# Patient Record
Sex: Male | Born: 1965 | Race: Black or African American | Hispanic: No | Marital: Married | Smoking: Never smoker
Health system: Southern US, Community
[De-identification: ages and names within clinical notes are randomized; demographics above are authoritative.]

## PROBLEM LIST (undated history)

## (undated) HISTORY — PX: APPENDECTOMY: SHX54

---

## 2018-12-02 ENCOUNTER — Encounter (HOSPITAL_BASED_OUTPATIENT_CLINIC_OR_DEPARTMENT_OTHER): Payer: Self-pay | Admitting: Emergency Medicine

## 2018-12-02 ENCOUNTER — Emergency Department (HOSPITAL_BASED_OUTPATIENT_CLINIC_OR_DEPARTMENT_OTHER): Payer: 59

## 2018-12-02 ENCOUNTER — Emergency Department (HOSPITAL_BASED_OUTPATIENT_CLINIC_OR_DEPARTMENT_OTHER)
Admission: EM | Admit: 2018-12-02 | Discharge: 2018-12-02 | Disposition: A | Discharge status: Home / Self Care | Payer: 59 | Attending: Emergency Medicine | Admitting: Emergency Medicine

## 2018-12-02 ENCOUNTER — Other Ambulatory Visit: Payer: Self-pay

## 2018-12-02 DIAGNOSIS — W0110XA Fall on same level from slipping, tripping and stumbling with subsequent striking against unspecified object, initial encounter: Secondary | ICD-10-CM | POA: Insufficient documentation

## 2018-12-02 DIAGNOSIS — Y9389 Activity, other specified: Secondary | ICD-10-CM | POA: Diagnosis not present

## 2018-12-02 DIAGNOSIS — Y998 Other external cause status: Secondary | ICD-10-CM | POA: Diagnosis not present

## 2018-12-02 DIAGNOSIS — S01521A Laceration with foreign body of lip, initial encounter: Secondary | ICD-10-CM | POA: Insufficient documentation

## 2018-12-02 DIAGNOSIS — Y92009 Unspecified place in unspecified non-institutional (private) residence as the place of occurrence of the external cause: Secondary | ICD-10-CM | POA: Insufficient documentation

## 2018-12-02 DIAGNOSIS — S01511A Laceration without foreign body of lip, initial encounter: Secondary | ICD-10-CM | POA: Diagnosis present

## 2018-12-02 DIAGNOSIS — R55 Syncope and collapse: Secondary | ICD-10-CM | POA: Diagnosis not present

## 2018-12-02 DIAGNOSIS — Z23 Encounter for immunization: Secondary | ICD-10-CM | POA: Insufficient documentation

## 2018-12-02 MED ORDER — AMOXICILLIN-POT CLAVULANATE 875-125 MG PO TABS: 1.0000 | ORAL_TABLET | Freq: Two times a day (BID) | ORAL | 0 refills | Status: AC

## 2018-12-02 MED ORDER — LIDOCAINE-EPINEPHRINE 1 %-1:100000 IJ SOLN
INTRAMUSCULAR | Status: AC
  Administered 2018-12-02: 1 mL
  Filled 2018-12-02: qty 1

## 2018-12-02 MED ORDER — AMOXICILLIN-POT CLAVULANATE 875-125 MG PO TABS
1.0000 | ORAL_TABLET | Freq: Once | ORAL | Status: AC
  Administered 2018-12-02: 1 via ORAL
  Filled 2018-12-02: qty 1

## 2018-12-02 MED ORDER — LIDOCAINE-EPINEPHRINE 2 %-1:100000 IJ SOLN
20.0000 mL | Freq: Once | INTRAMUSCULAR | Status: DC
  Filled 2018-12-02: qty 20

## 2018-12-02 MED ORDER — IBUPROFEN 600 MG PO TABS: 600.0000 mg | ORAL_TABLET | Freq: Four times a day (QID) | ORAL | 0 refills | Status: AC | PRN

## 2018-12-02 MED ORDER — TETANUS-DIPHTH-ACELL PERTUSSIS 5-2.5-18.5 LF-MCG/0.5 IM SUSP
0.5000 mL | Freq: Once | INTRAMUSCULAR | Status: AC
  Administered 2018-12-02: 0.5 mL via INTRAMUSCULAR
  Filled 2018-12-02: qty 0.5

## 2018-12-02 NOTE — Discharge Instructions (Addendum)
Take the antibiotics as prescribed.  Follow-up with the oral surgeon or your dentist.  If the sutures are still present in 10 daysthey should be removed by your primary doctor or dentist.  Return to the ED with new or worsening symptoms.

## 2018-12-02 NOTE — ED Provider Notes (Signed)
[facility] EMERGENCY DEPARTMENT Provider Note   CSN: [id number] Arrival date & time: 12/02/18  0302     History   Chief Complaint Chief Complaint  Patient presents with  . Mouth Injury    HPI Joel Fleming is a 52 y.o. male.  Patient presents with mouth injury after a fall at home.  States he got out of the bed to go to the bathroom in the dark and began to feel lightheaded.  He then fell forward and struck his face and unknown surface.  Did not lose consciousness.  Denies any preceding chest pain or shortness of breath.  No room spinning.  Did have nausea and felt like he needed to go to the bathroom.  Did not have any diarrhea.  Knocked out left upper tooth at home and sustained laceration to his lower lip.  Denies any blood thinner use.  No focal weakness, numbness or tingling.  The history is provided by the patient and the spouse.  Mouth Injury  Associated symptoms include headaches. Pertinent negatives include no chest pain, no abdominal pain and no shortness of breath.    History reviewed. No pertinent past medical history.  There are no active problems to display for this patient.   Past Surgical History:  Procedure Laterality Date  . APPENDECTOMY          Home Medications    Prior to Admission medications   Not on File    Family History No family history on file.  Social History Social History   Tobacco Use  . Smoking status: Never Smoker  . Smokeless tobacco: Never Used  Substance Use Topics  . Alcohol use: Never    Frequency: Never  . Drug use: Never     Allergies   Patient has no known allergies.   Review of Systems Review of Systems  Constitutional: Negative for activity change, appetite change and fever.  HENT: Negative for congestion.   Eyes: Negative for visual disturbance.  Respiratory: Negative for cough, chest tightness and shortness of breath.   Cardiovascular: Negative for chest pain.  Gastrointestinal:  Negative for abdominal pain, nausea and vomiting.  Genitourinary: Negative for dysuria and hematuria.  Musculoskeletal: Positive for myalgias.  Skin: Positive for wound.  Neurological: Positive for headaches. Negative for dizziness and syncope.    all other systems are negative except as noted in the HPI and PMH.    Physical Exam Updated Vital Signs BP (!) 151/97 (BP Location: Right Arm)   Pulse 67   Temp 99.1 F (37.3 C) (Oral)   Resp 18   Ht 5\' 10"  (1.778 m)   Wt 88.5 kg   SpO2 100%   BMI 27.98 kg/m   Physical Exam  Constitutional: He is oriented to person, place, and time. He appears well-developed and well-nourished. No distress.  HENT:  Head: Normocephalic and atraumatic.  Mouth/Throat: Oropharynx is clear and moist. No oropharyngeal exudate.  Left upper incisor is missing.  Left second incisor is loose.  Multiple missing teeth throughout that are chronic.  No malocclusion or trismus.  Through and through laceration of lower lip as depicted.  Eyes: Pupils are equal, round, and reactive to light. Conjunctivae and EOM are normal.  Neck: Normal range of motion. Neck supple.  No meningismus.  No C-spine tenderness  Cardiovascular: Normal rate, regular rhythm, normal heart sounds and intact distal pulses.  No murmur heard. Pulmonary/Chest: Effort normal and breath sounds normal. No respiratory distress.  Abdominal: Soft. There is no tenderness.  There is no rebound and no guarding.  Musculoskeletal: Normal range of motion. He exhibits no edema or tenderness.  Neurological: He is alert and oriented to person, place, and time. No cranial nerve deficit. He exhibits normal muscle tone. Coordination normal.  No ataxia on finger to nose bilaterally. No pronator drift. 5/5 strength throughout. CN 2-12 intact.Equal grip strength. Sensation intact.   Skin: Skin is warm.  Psychiatric: He has a normal mood and affect. His behavior is normal.  Nursing note and vitals  reviewed.          ED Treatments / Results  Labs (all labs ordered are listed, but only abnormal results are displayed) Labs Reviewed - No data to display  EKG EKG Interpretation  Date/Time:  Sunday December 02 2018 05:07:09 EST Ventricular Rate:  73 PR Interval:    QRS Duration: 88 QT Interval:  376 QTC Calculation: 415 R Axis:   52 Text Interpretation:  Sinus rhythm Borderline T abnormalities, inferior leads No old tracing to compare Confirmed by Glynn Octave [phone]) on 12/02/2018 5:39:00 AM   Radiology Ct Head Wo Contrast  Result Date: 12/02/2018 CLINICAL DATA:  Initial evaluation for acute trauma, fall. EXAM: CT HEAD WITHOUT CONTRAST CT MAXILLOFACIAL WITHOUT CONTRAST CT CERVICAL SPINE WITHOUT CONTRAST TECHNIQUE: Multidetector CT imaging of the head, cervical spine, and maxillofacial structures were performed using the standard protocol without intravenous contrast. Multiplanar CT image reconstructions of the cervical spine and maxillofacial structures were also generated. COMPARISON:  None. FINDINGS: CT HEAD FINDINGS Brain: Cerebral volume within normal limits. No acute intracranial hemorrhage. No acute large vessel territory infarct. No mass lesion, midline shift or mass effect. No hydrocephalus. No extra-axial fluid collection. Vascular: No hyperdense vessel. Skull: Scalp soft tissues within normal limits.  Calvarium intact. Other: Mastoid air cells clear. CT MAXILLOFACIAL FINDINGS Osseous: Zygomatic arches intact. Pterygoid plates intact. Nasal bones intact. Nasal septum midline and intact. Probable recent loss of the left maxillary central incisor. Associated nondisplaced fracture through the adjacent alveolar ridge (series 7, image 35). Fracture involves the root of the adjacent left lateral incisor. Alveolar plate otherwise intact. No acute mandibular fracture. Mandibular condyles normally situated. Poor dentition with multiple scattered dental caries noted within the  remaining teeth. Orbits: Globes and orbital soft tissues within normal limits. Bony orbits intact. Sinuses: Mild left maxillary sinus mucosal thickening. No hemosinus. Soft tissues: Soft tissue laceration present at the lower lip. No other acute soft tissue injury. CT CERVICAL SPINE FINDINGS Alignment: Straightening of the normal cervical lordosis. No listhesis or malalignment. Skull base and vertebrae: Skull base intact. Normal C1-2 articulations are preserved in the dens is intact. Vertebral body heights maintained. No acute fracture. Soft tissues and spinal canal: Visualized soft tissues of the neck demonstrate no acute finding. No abnormal prevertebral edema. Spinal canal within normal limits. Disc levels: Mild multilevel cervical spondylolysis at C3-4 through C6-7. Upper chest: Visualized upper chest demonstrates no acute finding. Visualized lung apices are clear. Other: None. IMPRESSION: CT HEAD: Negative head CT.  No acute intracranial abnormality. CT MAXILLOFACIAL: 1. Acute dislocation of the left maxillary central incisor with associated nondisplaced fracture through the underlying alveolar ridge. 2. Soft tissue swelling with laceration at the lower lip. 3. Poor dentition. CT CERVICAL SPINE: No acute traumatic injury within the cervical spine. Electronically Signed   By: Rise Mu M.D.   On: 12/02/2018 05:17   Ct Cervical Spine Wo Contrast  Result Date: 12/02/2018 CLINICAL DATA:  Initial evaluation for acute trauma, fall. EXAM: CT HEAD  WITHOUT CONTRAST CT MAXILLOFACIAL WITHOUT CONTRAST CT CERVICAL SPINE WITHOUT CONTRAST TECHNIQUE: Multidetector CT imaging of the head, cervical spine, and maxillofacial structures were performed using the standard protocol without intravenous contrast. Multiplanar CT image reconstructions of the cervical spine and maxillofacial structures were also generated. COMPARISON:  None. FINDINGS: CT HEAD FINDINGS Brain: Cerebral volume within normal limits. No acute  intracranial hemorrhage. No acute large vessel territory infarct. No mass lesion, midline shift or mass effect. No hydrocephalus. No extra-axial fluid collection. Vascular: No hyperdense vessel. Skull: Scalp soft tissues within normal limits.  Calvarium intact. Other: Mastoid air cells clear. CT MAXILLOFACIAL FINDINGS Osseous: Zygomatic arches intact. Pterygoid plates intact. Nasal bones intact. Nasal septum midline and intact. Probable recent loss of the left maxillary central incisor. Associated nondisplaced fracture through the adjacent alveolar ridge (series 7, image 35). Fracture involves the root of the adjacent left lateral incisor. Alveolar plate otherwise intact. No acute mandibular fracture. Mandibular condyles normally situated. Poor dentition with multiple scattered dental caries noted within the remaining teeth. Orbits: Globes and orbital soft tissues within normal limits. Bony orbits intact. Sinuses: Mild left maxillary sinus mucosal thickening. No hemosinus. Soft tissues: Soft tissue laceration present at the lower lip. No other acute soft tissue injury. CT CERVICAL SPINE FINDINGS Alignment: Straightening of the normal cervical lordosis. No listhesis or malalignment. Skull base and vertebrae: Skull base intact. Normal C1-2 articulations are preserved in the dens is intact. Vertebral body heights maintained. No acute fracture. Soft tissues and spinal canal: Visualized soft tissues of the neck demonstrate no acute finding. No abnormal prevertebral edema. Spinal canal within normal limits. Disc levels: Mild multilevel cervical spondylolysis at C3-4 through C6-7. Upper chest: Visualized upper chest demonstrates no acute finding. Visualized lung apices are clear. Other: None. IMPRESSION: CT HEAD: Negative head CT.  No acute intracranial abnormality. CT MAXILLOFACIAL: 1. Acute dislocation of the left maxillary central incisor with associated nondisplaced fracture through the underlying alveolar ridge. 2.  Soft tissue swelling with laceration at the lower lip. 3. Poor dentition. CT CERVICAL SPINE: No acute traumatic injury within the cervical spine. Electronically Signed   By: Rise MuBenjamin  McClintock M.D.   On: 12/02/2018 05:17   Ct Maxillofacial Wo Contrast  Result Date: 12/02/2018 CLINICAL DATA:  Initial evaluation for acute trauma, fall. EXAM: CT HEAD WITHOUT CONTRAST CT MAXILLOFACIAL WITHOUT CONTRAST CT CERVICAL SPINE WITHOUT CONTRAST TECHNIQUE: Multidetector CT imaging of the head, cervical spine, and maxillofacial structures were performed using the standard protocol without intravenous contrast. Multiplanar CT image reconstructions of the cervical spine and maxillofacial structures were also generated. COMPARISON:  None. FINDINGS: CT HEAD FINDINGS Brain: Cerebral volume within normal limits. No acute intracranial hemorrhage. No acute large vessel territory infarct. No mass lesion, midline shift or mass effect. No hydrocephalus. No extra-axial fluid collection. Vascular: No hyperdense vessel. Skull: Scalp soft tissues within normal limits.  Calvarium intact. Other: Mastoid air cells clear. CT MAXILLOFACIAL FINDINGS Osseous: Zygomatic arches intact. Pterygoid plates intact. Nasal bones intact. Nasal septum midline and intact. Probable recent loss of the left maxillary central incisor. Associated nondisplaced fracture through the adjacent alveolar ridge (series 7, image 35). Fracture involves the root of the adjacent left lateral incisor. Alveolar plate otherwise intact. No acute mandibular fracture. Mandibular condyles normally situated. Poor dentition with multiple scattered dental caries noted within the remaining teeth. Orbits: Globes and orbital soft tissues within normal limits. Bony orbits intact. Sinuses: Mild left maxillary sinus mucosal thickening. No hemosinus. Soft tissues: Soft tissue laceration present at the lower lip.  No other acute soft tissue injury. CT CERVICAL SPINE FINDINGS Alignment:  Straightening of the normal cervical lordosis. No listhesis or malalignment. Skull base and vertebrae: Skull base intact. Normal C1-2 articulations are preserved in the dens is intact. Vertebral body heights maintained. No acute fracture. Soft tissues and spinal canal: Visualized soft tissues of the neck demonstrate no acute finding. No abnormal prevertebral edema. Spinal canal within normal limits. Disc levels: Mild multilevel cervical spondylolysis at C3-4 through C6-7. Upper chest: Visualized upper chest demonstrates no acute finding. Visualized lung apices are clear. Other: None. IMPRESSION: CT HEAD: Negative head CT.  No acute intracranial abnormality. CT MAXILLOFACIAL: 1. Acute dislocation of the left maxillary central incisor with associated nondisplaced fracture through the underlying alveolar ridge. 2. Soft tissue swelling with laceration at the lower lip. 3. Poor dentition. CT CERVICAL SPINE: No acute traumatic injury within the cervical spine. Electronically Signed   By: Rise Mu M.D.   On: 12/02/2018 05:17    Procedures .Marland KitchenLaceration Repair Date/Time: 12/02/2018 5:23 AM Performed by: Glynn Octave, MD Authorized by: Glynn Octave, MD   Consent:    Consent obtained:  Verbal   Consent given by:  Patient   Risks discussed:  Infection, pain, poor cosmetic result, poor wound healing and retained foreign body   Alternatives discussed:  No treatment Anesthesia (see MAR for exact dosages):    Anesthesia method:  Local infiltration   Local anesthetic:  Lidocaine 1% WITH epi Laceration details:    Location:  Lip   Lip location:  Lower lip, full thickness   Vermilion border involved: yes     Height of lip laceration:  More than half vertical height   Length (cm):  4 Repair type:    Repair type:  Complex Pre-procedure details:    Preparation:  Patient was prepped and draped in usual sterile fashion and imaging obtained to evaluate for foreign bodies Exploration:    Limited  defect created (wound extended): no     Hemostasis achieved with:  Epinephrine and direct pressure   Wound exploration: wound explored through full range of motion and entire depth of wound probed and visualized     Wound extent: foreign bodies/material and underlying fracture     Wound extent: no nerve damage noted and no tendon damage noted     Foreign bodies/material:  Debris   Contaminated: yes   Treatment:    Area cleansed with:  Shur-Clens and saline   Amount of cleaning:  Extensive   Irrigation solution:  Sterile water   Irrigation method:  Syringe   Visualized foreign bodies/material removed: yes     Debridement:  None   Undermining:  None   Scar revision: no   Skin repair:    Repair method:  Sutures   Suture size:  4-0   Wound skin closure material used: vicryl.   Suture technique:  Simple interrupted   Number of sutures:  6 Approximation:    Approximation:  Close   Vermilion border: well-aligned   Post-procedure details:    Dressing:  Antibiotic ointment   Patient tolerance of procedure:  Tolerated well, no immediate complications   (including critical care time)  Medications Ordered in ED Medications  Tdap (BOOSTRIX) injection 0.5 mL (has no administration in time range)  lidocaine-EPINEPHrine (XYLOCAINE W/EPI) 2 %-1:100000 (with pres) injection 20 mL (has no administration in time range)     Initial Impression / Assessment and Plan / ED Course  I have reviewed the triage vital signs and the  nursing notes.  Pertinent labs & imaging results that were available during my care of the patient were reviewed by me and considered in my medical decision making (see chart for details).    Near syncope with fall and facial injury with missing teeth and lower lip laceration.  No chest pain or shortness of breath.  Episode of dizziness was preceded by lightheadedness and nausea.  No vertigo.  Patient believes that he got up too quickly and fell in the dark.  Prodrome  suggests vasovagal syncope.  EKG is sinus rhythm.  There is no evidence of prolonged QT.  Complex through and through lip laceration with missing teeth.  Patient does not have teeth with him.  Wound extensively cleaned.  Laceration cleaned and repaired as above.  Patient will be given prophylactic antibiotics and tetanus updated.  Imaging shows no traumatic brain injury.  There is missing upper incisor with associated alveolar ridge fracture. Patient states the missing tooth is at home somewhere.  Patient to follow-up with his dentist as well as oral surgery.  He will be given prophylactic antibiotics.  Tetanus is up-to-date.  He is tolerating p.o. and ambulatory. Final Clinical Impressions(s) / ED Diagnoses   Final diagnoses:  Complicated laceration of lip, initial encounter  Syncope, unspecified syncope type    ED Discharge Orders    None       Hildegarde Dunaway, Jeannett Senior, MD 12/02/18 [phone]

## 2018-12-02 NOTE — ED Triage Notes (Signed)
S/p fall at home. States he thinks he got up too quickly, got dizzy and fell in the dark at home around 0245. Pt states one front tooth fell out, the other is loose. Lip lac to bottom R side of lip. Denies LOC.

## 2018-12-02 NOTE — ED Notes (Signed)
Patient transported to CT 

## 2019-11-30 IMAGING — CT CT MAXILLOFACIAL W/O CM
4 of 10 series · 16 of 47 positions shown, 17 images · non-contrast
Comparison: None.

CLINICAL DATA: Initial evaluation for acute trauma, fall.

EXAM:
CT HEAD WITHOUT CONTRAST
CT MAXILLOFACIAL WITHOUT CONTRAST
CT CERVICAL SPINE WITHOUT CONTRAST
TECHNIQUE: Multidetector CT imaging of the head, cervical spine, and
maxillofacial structures were performed using the standard protocol
without intravenous contrast. Multiplanar CT image reconstructions
of the cervical spine and maxillofacial structures were also
generated.

[Series 2: head wo · axial · 0.46mm/px · z∈[+889,+939]mm · 2 of 31 slices shown, 3 images]
[im 11/31  brain]
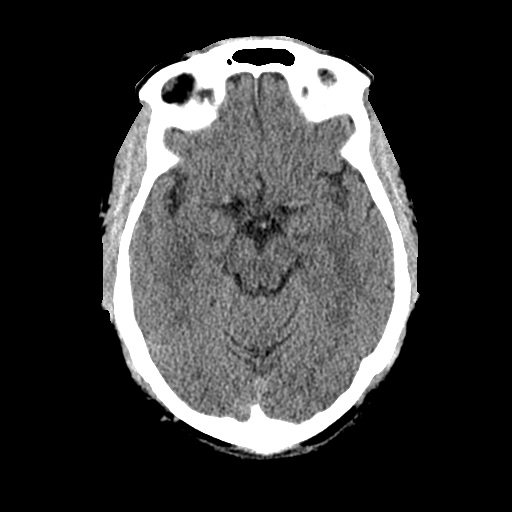
[im 11/31  bone]
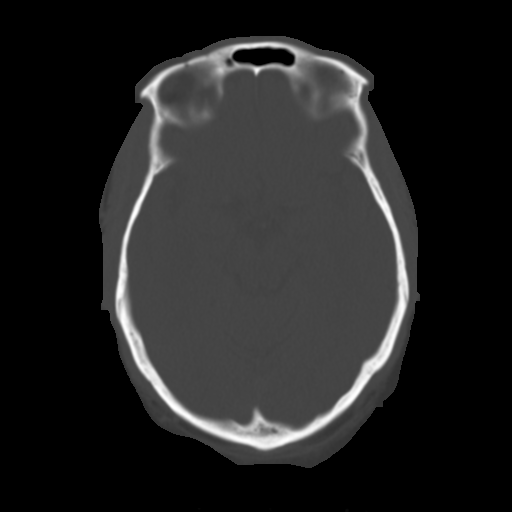
[im 21/31  bone]
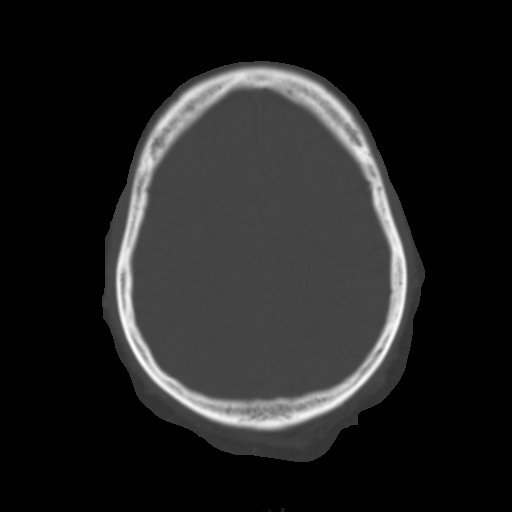

[Series 4: head coronal · coronal · 0.30mm/px · 3 of 84 slices shown]
[im 21/84  bone]
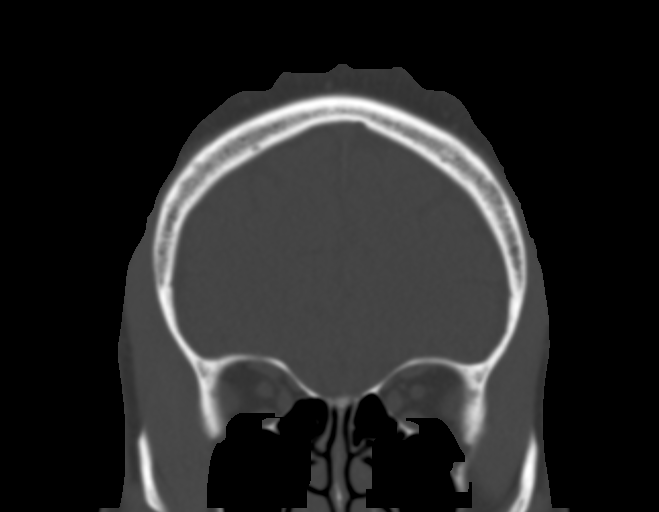
[im 42/84  bone]
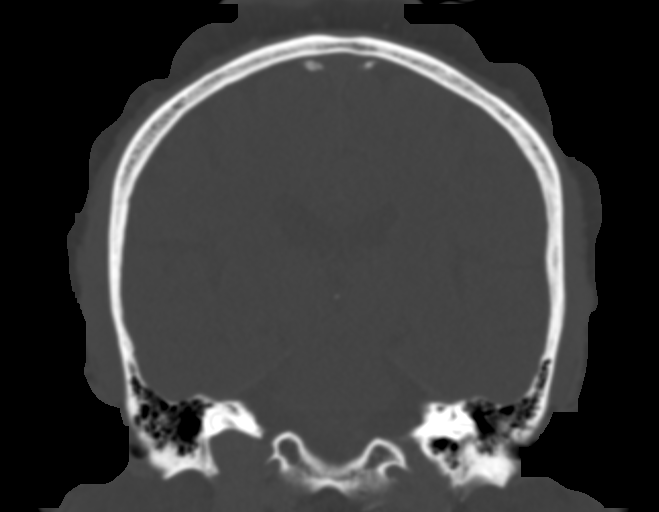
[im 63/84  bone]
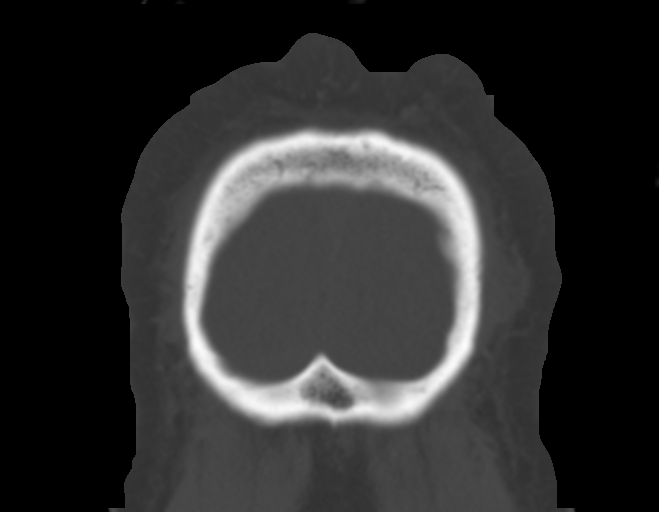

[Series 6: max soft · axial · 0.37mm/px · z∈[+751,+905]mm · 8 of 92 slices shown]
[im 8/92  brain]
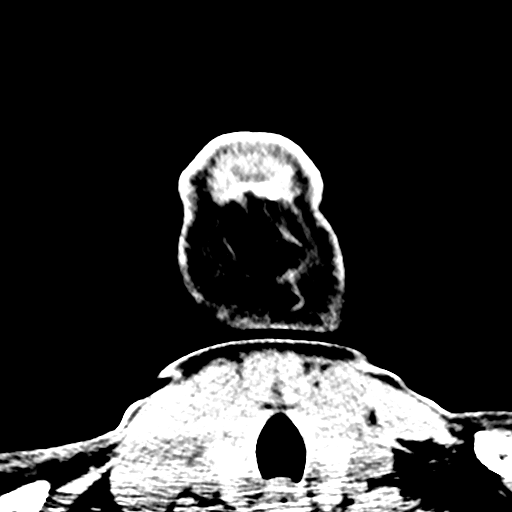
[im 22/92  brain]
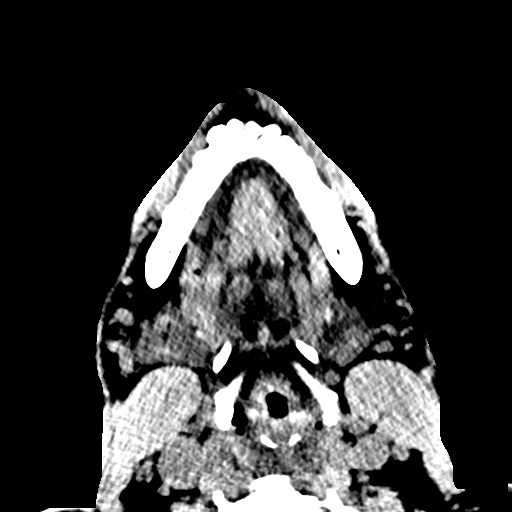
[im 29/92  brain]
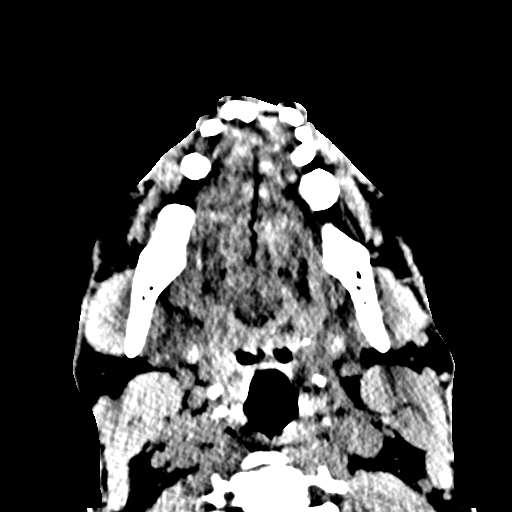
[im 43/92  brain]
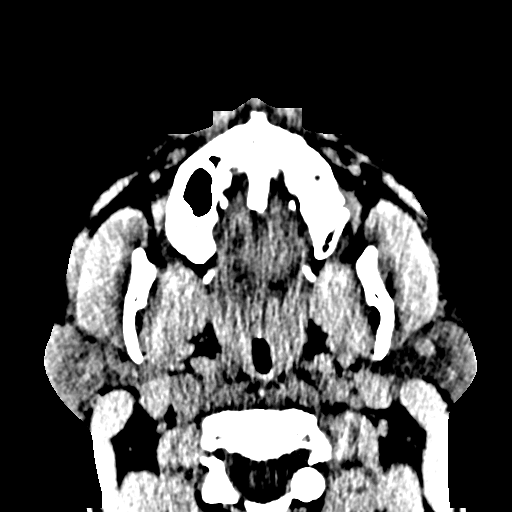
[im 50/92  brain]
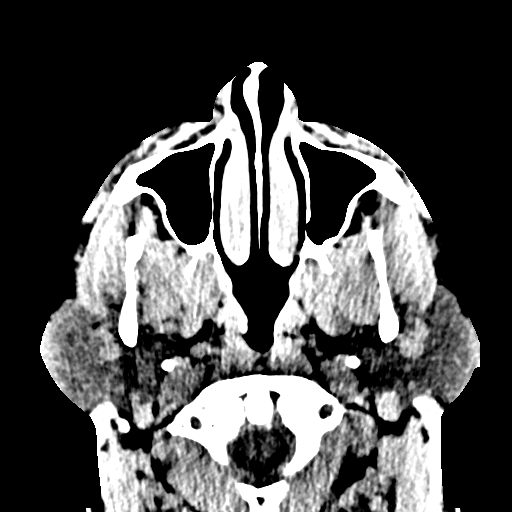
[im 64/92  brain]
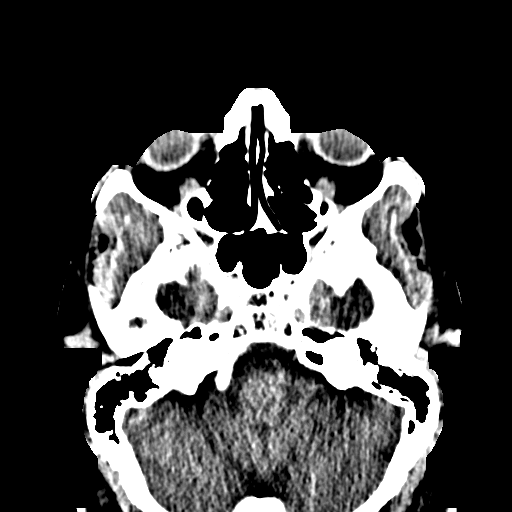
[im 71/92  brain]
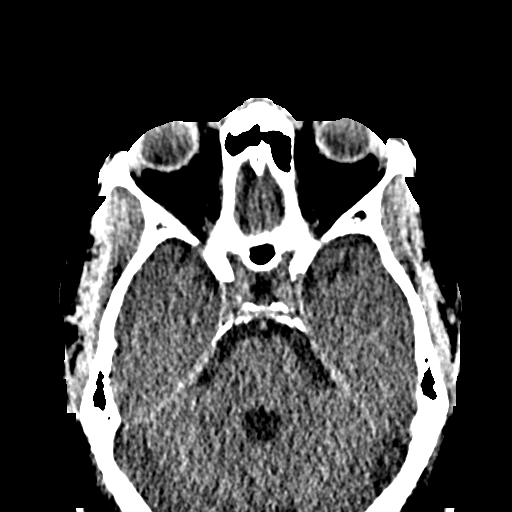
[im 85/92  brain]
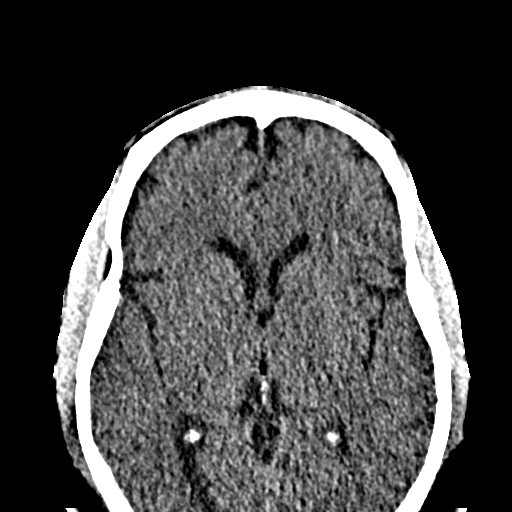

[Series 15: c spine soft · axial · 0.33mm/px · z∈[+728,+770]mm · 3 of 78 slices shown]
[im 8/78  brain]
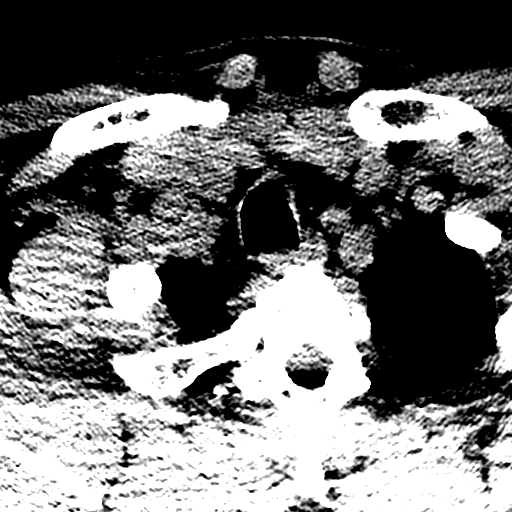
[im 15/78  brain]
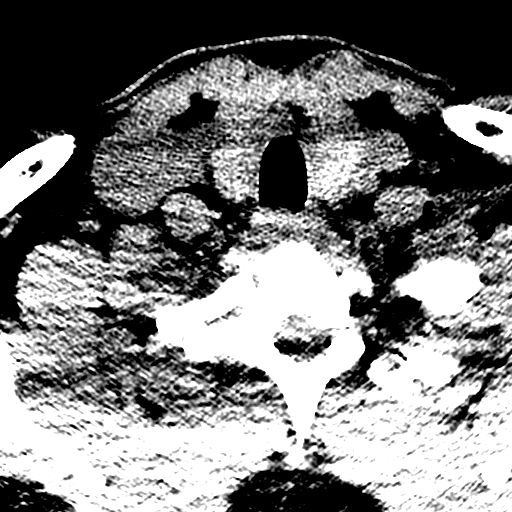
[im 29/78  brain]
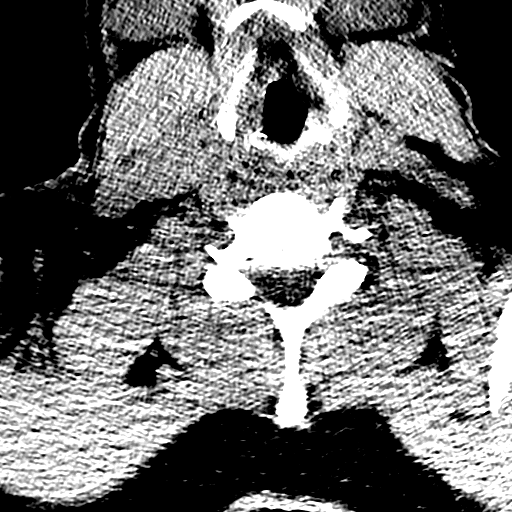

[16 of 47 positions shown; findings below may reference images not displayed]

FINDINGS: CT HEAD FINDINGS

Brain: Cerebral volume within normal limits. No acute intracranial
hemorrhage. No acute large vessel territory infarct. No mass lesion,
midline shift or mass effect. No hydrocephalus. No extra-axial fluid
collection.

Vascular: No hyperdense vessel.

Skull: Scalp soft tissues within normal limits.  Calvarium intact.

Other: Mastoid air cells clear.

CT MAXILLOFACIAL FINDINGS

Osseous: Zygomatic arches intact. Pterygoid plates intact. Nasal
bones intact. Nasal septum midline and intact. Probable recent loss
of the left maxillary central incisor. Associated nondisplaced
fracture through the adjacent alveolar ridge (series 7, image 35).
Fracture involves the root of the adjacent left lateral incisor.
Alveolar plate otherwise intact. No acute mandibular fracture.
Mandibular condyles normally situated. Poor dentition with multiple
scattered dental caries noted within the remaining teeth.

Orbits: Globes and orbital soft tissues within normal limits. Bony
orbits intact.

Sinuses: Mild left maxillary sinus mucosal thickening. No hemosinus.

Soft tissues: Soft tissue laceration present at the lower lip. No
other acute soft tissue injury.

CT CERVICAL SPINE FINDINGS

Alignment: Straightening of the normal cervical lordosis. No
listhesis or malalignment.

Skull base and vertebrae: Skull base intact. Normal C1-2
articulations are preserved in the dens is intact. Vertebral body
heights maintained. No acute fracture.

Soft tissues and spinal canal: Visualized soft tissues of the neck
demonstrate no acute finding. No abnormal prevertebral edema. Spinal
canal within normal limits.

Disc levels: Mild multilevel cervical spondylolysis at C3-4 through
C6-7.

Upper chest: Visualized upper chest demonstrates no acute finding.
Visualized lung apices are clear.

Other: None.
IMPRESSION: CT HEAD:

Negative head CT.  No acute intracranial abnormality.

CT MAXILLOFACIAL:

1. Acute dislocation of the left maxillary central incisor with
associated nondisplaced fracture through the underlying alveolar
ridge.
2. Soft tissue swelling with laceration at the lower lip.
3. Poor dentition.

CT CERVICAL SPINE:

No acute traumatic injury within the cervical spine.
